# Patient Record
Sex: Male | Born: 1960 | Race: White | Hispanic: No | Smoking: Never smoker
Health system: Southern US, Community
[De-identification: ages and names within clinical notes are randomized; demographics above are authoritative.]

## PROBLEM LIST (undated history)

## (undated) DIAGNOSIS — F419 Anxiety disorder, unspecified: Secondary | ICD-10-CM

## (undated) DIAGNOSIS — M51369 Other intervertebral disc degeneration, lumbar region without mention of lumbar back pain or lower extremity pain: Secondary | ICD-10-CM

## (undated) DIAGNOSIS — M21611 Bunion of right foot: Secondary | ICD-10-CM

## (undated) DIAGNOSIS — F900 Attention-deficit hyperactivity disorder, predominantly inattentive type: Secondary | ICD-10-CM

## (undated) DIAGNOSIS — M5136 Other intervertebral disc degeneration, lumbar region: Secondary | ICD-10-CM

## (undated) DIAGNOSIS — G894 Chronic pain syndrome: Secondary | ICD-10-CM

## (undated) DIAGNOSIS — E669 Obesity, unspecified: Secondary | ICD-10-CM

## (undated) DIAGNOSIS — F32A Depression, unspecified: Secondary | ICD-10-CM

## (undated) DIAGNOSIS — M1712 Unilateral primary osteoarthritis, left knee: Secondary | ICD-10-CM

## (undated) DIAGNOSIS — N529 Male erectile dysfunction, unspecified: Secondary | ICD-10-CM

## (undated) DIAGNOSIS — M109 Gout, unspecified: Secondary | ICD-10-CM

## (undated) DIAGNOSIS — E7800 Pure hypercholesterolemia, unspecified: Secondary | ICD-10-CM

## (undated) DIAGNOSIS — I10 Essential (primary) hypertension: Secondary | ICD-10-CM

## (undated) DIAGNOSIS — F329 Major depressive disorder, single episode, unspecified: Secondary | ICD-10-CM

## (undated) DIAGNOSIS — G4700 Insomnia, unspecified: Secondary | ICD-10-CM

## (undated) DIAGNOSIS — M1A09X0 Idiopathic chronic gout, multiple sites, without tophus (tophi): Secondary | ICD-10-CM

## (undated) HISTORY — DX: Attention-deficit hyperactivity disorder, predominantly inattentive type: F90.0

## (undated) HISTORY — DX: Depression, unspecified: F32.A

## (undated) HISTORY — DX: Anxiety disorder, unspecified: F41.9

## (undated) HISTORY — DX: Insomnia, unspecified: G47.00

## (undated) HISTORY — DX: Male erectile dysfunction, unspecified: N52.9

## (undated) HISTORY — DX: Obesity, unspecified: E66.9

## (undated) HISTORY — DX: Unilateral primary osteoarthritis, left knee: M17.12

## (undated) HISTORY — DX: Idiopathic chronic gout, multiple sites, without tophus (tophi): M1A.09X0

## (undated) HISTORY — DX: Other intervertebral disc degeneration, lumbar region: M51.36

## (undated) HISTORY — DX: Other intervertebral disc degeneration, lumbar region without mention of lumbar back pain or lower extremity pain: M51.369

## (undated) HISTORY — DX: Bunion of right foot: M21.611

## (undated) HISTORY — PX: ANKLE SURGERY: SHX546

## (undated) HISTORY — DX: Chronic pain syndrome: G89.4

---
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
## 2000-11-09 ENCOUNTER — Encounter: Payer: Self-pay | Admitting: Cardiovascular Disease

## 2000-11-09 ENCOUNTER — Ambulatory Visit (HOSPITAL_COMMUNITY): Admission: RE | Admit: 2000-11-09 | Discharge: 2000-11-09 | Payer: Self-pay | Admitting: Cardiovascular Disease

## 2013-12-25 HISTORY — PX: COLONOSCOPY: SHX174

## 2014-05-28 ENCOUNTER — Emergency Department (HOSPITAL_COMMUNITY)
Admission: EM | Admit: 2014-05-28 | Discharge: 2014-05-28 | Disposition: A | Discharge status: Home / Self Care | Payer: Worker's Compensation | Attending: Emergency Medicine | Admitting: Emergency Medicine

## 2014-05-28 ENCOUNTER — Encounter (HOSPITAL_COMMUNITY): Payer: Self-pay | Admitting: Emergency Medicine

## 2014-05-28 ENCOUNTER — Emergency Department (HOSPITAL_COMMUNITY): Payer: Worker's Compensation

## 2014-05-28 DIAGNOSIS — Y9289 Other specified places as the place of occurrence of the external cause: Secondary | ICD-10-CM | POA: Insufficient documentation

## 2014-05-28 DIAGNOSIS — Y9389 Activity, other specified: Secondary | ICD-10-CM | POA: Insufficient documentation

## 2014-05-28 DIAGNOSIS — Z862 Personal history of diseases of the blood and blood-forming organs and certain disorders involving the immune mechanism: Secondary | ICD-10-CM | POA: Insufficient documentation

## 2014-05-28 DIAGNOSIS — Z8639 Personal history of other endocrine, nutritional and metabolic disease: Secondary | ICD-10-CM | POA: Insufficient documentation

## 2014-05-28 DIAGNOSIS — W230XXA Caught, crushed, jammed, or pinched between moving objects, initial encounter: Secondary | ICD-10-CM | POA: Insufficient documentation

## 2014-05-28 DIAGNOSIS — S61209A Unspecified open wound of unspecified finger without damage to nail, initial encounter: Secondary | ICD-10-CM | POA: Insufficient documentation

## 2014-05-28 DIAGNOSIS — I10 Essential (primary) hypertension: Secondary | ICD-10-CM | POA: Insufficient documentation

## 2014-05-28 DIAGNOSIS — S61212A Laceration without foreign body of right middle finger without damage to nail, initial encounter: Secondary | ICD-10-CM

## 2014-05-28 HISTORY — DX: Pure hypercholesterolemia, unspecified: E78.00

## 2014-05-28 HISTORY — DX: Gout, unspecified: M10.9

## 2014-05-28 HISTORY — DX: Essential (primary) hypertension: I10

## 2014-05-28 MED ORDER — HYDROCODONE-ACETAMINOPHEN 5-325 MG PO TABS: 1.0000 | ORAL_TABLET | Freq: Four times a day (QID) | ORAL | Status: DC | PRN

## 2014-05-28 NOTE — Discharge Instructions (Signed)
Please have your sutures removed in 7 days.  Follow instruction below.  Return if you have any concerns.   Fingertip Injuries and Amputations Fingertip injuries are common and often get injured because they are last to escape when pulling your hand out of harm's way. You have amputated (cut off) part of your finger. How this turns out depends largely on how much was amputated. If just the tip is amputated, often the end of the finger will grow back and the finger may return to much the same as it was before the injury.  If more of the finger is missing, your caregiver has done the best with the tissue remaining to allow you to keep as much finger as is possible. Your caregiver after checking your injury has tried to leave you with a painless fingertip that has durable, feeling skin. If possible, your caregiver has tried to maintain the finger's length and appearance and preserve its fingernail.  Please read the instructions outlined below and refer to this sheet in the next few weeks. These instructions provide you with general information on caring for yourself. Your caregiver may also give you specific instructions. While your treatment has been done according to the most current medical practices available, unavoidable complications occasionally occur. If you have any problems or questions after discharge, please call your caregiver. HOME CARE INSTRUCTIONS   You may resume normal diet and activities as directed or allowed.  Keep your hand elevated above the level of your heart. This helps decrease pain and swelling.  Keep ice packs (or a bag of ice wrapped in a towel) on the injured area for 15-20 minutes, 03-04 times per day, for the first two days.  Change dressings if necessary or as directed.  Clean the wound daily or as directed.  Only take over-the-counter or prescription medicines for pain, discomfort, or fever as directed by your caregiver.  Keep appointments as directed. SEEK  IMMEDIATE MEDICAL CARE IF:  You develop redness, swelling, numbness or increasing pain in the wound.  There is pus coming from the wound.  You develop an unexplained oral temperature above 102 F (38.9 C) or as your caregiver suggests.  There is a foul (bad) smell coming from the wound or dressing.  There is a breaking open of the wound (edges not staying together) after sutures or staples have been removed. MAKE SURE YOU:   Understand these instructions.  Will watch your condition.  Will get help right away if you are not doing well or get worse. Document Released: 11/01/2005 Document Revised: 03/04/2012 Document Reviewed: 09/30/2008 [facility] Patient Information 2014 [place], [place].

## 2014-05-28 NOTE — ED Notes (Signed)
PA in to suture

## 2014-05-28 NOTE — ED Notes (Signed)
Ortho tech paged for placement of splint.

## 2014-05-28 NOTE — ED Notes (Signed)
Pt presents with laceration to R 3rd finger after getting finger crushed in cigarette paper rolls.  Injury occurred at work PTA.

## 2014-05-28 NOTE — ED Provider Notes (Signed)
CSN: [id number]     Arrival date & time 05/28/14  1209 History  This chart was scribed for Domenic Moras PA-C working with Adrian, DO by Stacy Gardner, ED scribe. This patient was seen in room TR10C/TR10C and the patient's care was started at 1:00 PM.  First MD Initiated Contact with Patient 05/28/14 1241     No chief complaint on file.    (Consider location/radiation/quality/duration/timing/severity/associated sxs/prior Treatment) The history is provided by the patient and medical records. No language interpreter was used.   HPI Comments: Ryan Bowen is a 53 y.o. male who presents to the Emergency Department complaining of right third finger injury. Pt crushed the tip of the dorsal aspect of his right middle finger while lifting a cigarette paper roll at work today. He wrapped the site in gauze and the bleeding is controlled.  His tetanus shot is UTD. Pt has a regular PCP.  Past Medical History  Diagnosis Date  . Gout   . Hypertension   . Hypercholesteremia    Past Surgical History  Procedure Laterality Date  . Ankle surgery     History reviewed. No pertinent family history. History  Substance Use Topics  . Smoking status: Never Smoker   . Smokeless tobacco: Not on file  . Alcohol Use: Yes    Review of Systems  Skin: Positive for wound.       Right middle finger  All other systems reviewed and are negative.     Allergies  Review of patient's allergies indicates no known allergies.  Home Medications   Prior to Admission medications   Not on File   BP 163/95  Pulse 98  Temp(Src) 98.2 F (36.8 C) (Oral)  Resp 18  Ht 5\' 10"  (1.778 m)  Wt 250 lb (113.399 kg)  BMI 35.87 kg/m2  SpO2 98% Physical Exam  Nursing note and vitals reviewed. Constitutional: He is oriented to person, place, and time. He appears well-developed and well-nourished.  HENT:  Head: Normocephalic.  Eyes: Pupils are equal, round, and reactive to light.  Neck: Normal range of motion.   Cardiovascular: Normal rate.   Pulmonary/Chest: Effort normal.  Musculoskeletal: Normal range of motion. He exhibits tenderness (right third finger).  Neurological: He is alert and oriented to person, place, and time.  Skin: Skin is warm and dry. He is not diaphoretic.  Right hand middle finger evulsed skin to the pad of finger measuring 3 cm in circumferance. Sensation is intact.       ED Course  Procedures (including critical care time) DIAGNOSTIC STUDIES: Oxygen Saturation is 98% on room air, normal by my interpretation.    COORDINATION OF CARE:  1:00 PM Discussed course of care with pt which includes suture repair.  Pt would like to start work soon. I advised to use precaution and using a finger splint. Recommended pt to do not wet the area in less than 12 hours. Discussed with pt the results of the x-ray. Pt understands and agrees. 1:07 PM LACERATION REPAIR Performed by: Domenic Moras PA-C Consent: Verbal consent obtained. Risks and benefits: risks, benefits and alternatives were discussed Patient identity confirmed: provided demographic data Time out performed prior to procedure Prepped and Draped in normal sterile fashion Wound explored Laceration Location: right middle finger Laceration Length: 3 cm No Foreign Bodies seen or palpated Anesthesia: local infiltration Local anesthetic: lidocaine 2% w/o epinephrine Anesthetic total: 6 ml Irrigation method: syringe Amount of cleaning: standard Skin closure: simple Number of sutures or staples: 9  Technique: interrupted Patient tolerance: Patient tolerated the procedure well with no immediate complications.  2:13 PM Xray without acute fx.  Laceration was repaired.  Finger splint applied.  No bone involvement.  abx not indicated at this time.  Pt does made aware for signs of infection.  Sutures remove in 7 days.  Labs Review Labs Reviewed - No data to display  Imaging Review    EKG Interpretation None      MDM    Final diagnoses:  Laceration of right middle finger w/o foreign body w/o damage to nail    BP 143/85  Pulse 89  Temp(Src) 98.2 F (36.8 C) (Oral)  Resp 18  Ht 5\' 10"  (1.778 m)  Wt 250 lb (113.399 kg)  BMI 35.87 kg/m2  SpO2 100%  I have reviewed nursing notes and vital signs. I personally reviewed the imaging tests through PACS system  I reviewed available ER/hospitalization records thought the EMR   I personally performed the services described in this documentation, which was scribed in my presence. The recorded information has been reviewed and is accurate.     Domenic Moras, PA-C 05/28/14 1416

## 2014-05-28 NOTE — ED Notes (Signed)
Pt was loading 70lb boxes when one fell onto right middle finger. Crush injury noted to tip of right middle finger. Pt denies significant pain.

## 2014-05-28 NOTE — ED Provider Notes (Signed)
Medical screening examination/treatment/procedure(s) were performed by non-physician practitioner and as supervising physician I was immediately available for consultation/collaboration.   EKG Interpretation None        Delice Bison Savannah Morford, DO 05/28/14 1546

## 2014-06-24 ENCOUNTER — Ambulatory Visit: Payer: Self-pay

## 2014-07-07 ENCOUNTER — Ambulatory Visit (INDEPENDENT_AMBULATORY_CARE_PROVIDER_SITE_OTHER): Payer: 59

## 2014-07-07 VITALS — BP 150/90 | HR 94 | Resp 16 | Ht 71.0 in | Wt 220.0 lb

## 2014-07-07 DIAGNOSIS — M2020 Hallux rigidus, unspecified foot: Secondary | ICD-10-CM

## 2014-07-07 DIAGNOSIS — M21612 Bunion of left foot: Principal | ICD-10-CM

## 2014-07-07 DIAGNOSIS — M79609 Pain in unspecified limb: Secondary | ICD-10-CM

## 2014-07-07 DIAGNOSIS — M21619 Bunion of unspecified foot: Secondary | ICD-10-CM

## 2014-07-07 DIAGNOSIS — M21611 Bunion of right foot: Secondary | ICD-10-CM

## 2014-07-07 DIAGNOSIS — M79606 Pain in leg, unspecified: Secondary | ICD-10-CM

## 2014-07-07 DIAGNOSIS — M19079 Primary osteoarthritis, unspecified ankle and foot: Secondary | ICD-10-CM

## 2014-07-07 NOTE — Patient Instructions (Signed)
Hallux Rigidus Hallux rigidus is a condition involving pain and a loss of motion of the first (big) toe. The pain gets worse with lifting up (extension) of the toe. This is usually due to arthritic bony bumps (spurring) of the joint at the base of the big toe.  SYMPTOMS   Pain, with lifting up of the toe.  Tenderness over the joint where the big toe meets the foot.  Redness, swelling, and warmth over the top of the base of the big toe (sometimes).  Foot pain, stiffness, and limping. CAUSES  Halllux rigidus is caused by arthritis of the joint where the big toe meets the foot. The arthritis creates a bone spur that pinches the soft tissues, when the toe is extended. RISK INCREASES WITH:  Tight shoes, with a narrow toe box.  Family history of foot problems.  Gout and rheumatoid and psoriatic arthritis.  History of previous toe injury, including "turf toe."  Long first toe, flat feet, and other big toe bony bumps.  Arthritis of the big toe. PREVENTION   Wear wide toed shoes that fit well.  Tape the big toe, to reduce motion and to prevent pinching of the tissues between the bone.  Maintain physical fitness:  Foot and ankle flexibility.  Muscle strength and endurance. PROGNOSIS  This condition can usually be managed with proper treatment. However, surgery is typically required to prevent the problem from recurring.  RELATED COMPLICATIONS  Injury to other areas of the foot or ankle, caused by abnormal walking in an attempt to avoid the pain felt when walking normally. TREATMENT Treatment first involves stopping the activities that aggravate your symptoms. Ice and medicine can be used to reduce the pain and inflammation. Modifications to shoes may help reduce pain, including wearing stiff-soled shoes, shoes with a wide toe box, inserting a padded donut to relieve pressure on top of the joint, or wearing an arch support. Corticosteroid injections may be given to reduce inflammation.  If non-surgical treatment is unsuccessful, surgery may be needed. Surgical options include removing the arthritic bony spur, cutting a bone in the foot to change the arc of motion (allowing the toe to extend more), or fusion of the joint (eliminating all motion in the joint at the base of the big toe).  MEDICATION   If pain medicine is needed, nonsteroidal anti-inflammatory medicines (aspirin and ibuprofen), or other minor pain relievers (acetaminophen), are often advised.  Do not take pain medicine for 7 days before surgery.  Prescription pain relievers are usually prescribed only after surgery. Use only as directed and only as much as you need.  Ointments for arthritis, applied to the skin, may give some relief.  Injections of corticosteroids may be given to reduce inflammation. HEAT AND COLD  Cold treatment (icing) relieves pain and reduces inflammation. Cold treatment should be applied for 10 to 15 minutes every 2 to 3 hours, and immediately after activity that aggravates your symptoms. Use ice packs or an ice massage.  Heat treatment may be used before performing the stretching and strengthening activities prescribed by your caregiver, physical therapist, or athletic trainer. Use a heat pack or a warm water soak. SEEK MEDICAL CARE IF:   Symptoms get worse or do not improve in 2 weeks, despite treatment.  After surgery you develop fever, increasing pain, redness, swelling, drainage of fluids, bleeding, or increasing warmth.  New, unexplained symptoms develop. (Drugs used in treatment may produce side effects.) Document Released: 12/11/2005 Document Revised: 03/04/2012 Document Reviewed: 03/25/2009 [employer] Patient   Information 2015 [place], [place]. This information is not intended to replace advice given to you by your health care provider. Make sure you discuss any questions you have with your health care provider.    The joint implants often used in your situation would be an  Encompas great toe implant

## 2014-07-07 NOTE — Progress Notes (Signed)
   Subjective:    Patient ID: Ryan Bowen, male    DOB: 1961-06-11, 53 y.o.   MRN: [id number]  HPI Comments: N bunions L B/L 1st toes, and MPJ D 1 month O history of episodes of gout C 1st toes abduct and 1st MPJs are enlarged and painful A long period of walking and occasionally the steeled toed shoes T pt states he is on a gout medication, but is not listed     Review of Systems  All other systems reviewed and are negative.      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 Patient has a history of gouty arthropathy flareups is not sure which medicines taking maybe colchicine and taking 3 times daily. Hasn't had a recent gout flareups there is no edema erythema currently however has limited range of motion both great toe joints the bony prominence noted clinically and radiographically both dorsal and the medial bunions are identified  J objective findings as follows vascular status appears to be intact with pedal pulses palpable DP +2/4 bilateral PT plus one over 4 bilateral capillary refill time 3 seconds all digits epicritic and proprioceptive sensations intact and symmetric bilateral there is normal plantar response and DTRs. Dermatologically skin color pigment normal hair growth present nails are unremarkable rectus foot type otherwise noted x-rays reveal asymmetric joint space narrowing and para-articular spurring of the first MTP joints bilateral with large dorsal osteophytes both great toe joints. Patient does have limited range of motion possibly 10-15 total range of the great toe joints bilateral there is pain and crepitus at end ranges of motion. The remaining digits show mild semirigid digital contractures no signs of fracture no other osseous abnormalities are identified. No open wounds ulcerations no other skin issues are  identified.       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 Plan at this time dispensed literature about arthritis of the great toe joint and hallux rigidus patient may be candidate for wrist MTP joint implant or possibly first MTP fusion. Advised that if we did implant the procedure can be done bilateral and the patient would be ambulatory however if the fusion was selected you have be nonweightbearing only 1 foot at a time can be done. I do feel patient is a good candidate for implant arthroplasty at some point his joint is considerably deteriorated this time with very limited range of motion still present patient maintaining good stable shoe moderate activities has been taking hydrocodone for his gout flareups when they do occur. Followup at his convenience for possible further surgical consult when ready next progress Harriet Masson DPM

## 2014-07-10 ENCOUNTER — Ambulatory Visit: Payer: Self-pay

## 2014-11-17 ENCOUNTER — Telehealth: Payer: Self-pay | Admitting: *Deleted

## 2014-11-17 NOTE — Telephone Encounter (Signed)
Pt states he would like to schedule surgery with Dr. Blenda Mounts to replace his big toe joints.  I instructed pt to schedule surgery date and the make an appt prior to the surgery day to discuss surgery and sign consents  Pt chose 12/30/2014.  I referred to the schedulers.

## 2014-11-23 NOTE — Telephone Encounter (Signed)
Surgery consult appointment scheduled

## 2014-12-08 ENCOUNTER — Ambulatory Visit (INDEPENDENT_AMBULATORY_CARE_PROVIDER_SITE_OTHER): Payer: 59

## 2014-12-08 VITALS — BP 154/85 | HR 106 | Resp 18

## 2014-12-08 DIAGNOSIS — M2011 Hallux valgus (acquired), right foot: Secondary | ICD-10-CM

## 2014-12-08 DIAGNOSIS — M19079 Primary osteoarthritis, unspecified ankle and foot: Secondary | ICD-10-CM

## 2014-12-08 DIAGNOSIS — M109 Gout, unspecified: Secondary | ICD-10-CM

## 2014-12-08 DIAGNOSIS — M1A9XX1 Chronic gout, unspecified, with tophus (tophi): Secondary | ICD-10-CM

## 2014-12-08 DIAGNOSIS — M21611 Bunion of right foot: Secondary | ICD-10-CM

## 2014-12-08 DIAGNOSIS — M2012 Hallux valgus (acquired), left foot: Secondary | ICD-10-CM

## 2014-12-08 DIAGNOSIS — M2020 Hallux rigidus, unspecified foot: Secondary | ICD-10-CM

## 2014-12-08 DIAGNOSIS — M21612 Bunion of left foot: Principal | ICD-10-CM

## 2014-12-08 NOTE — Patient Instructions (Signed)
Information for patients with Gout  Gout defined-Gout occurs when urate crystals accumulate in your joint causing the inflammation and intense pain of gout attack.  Urate crystals can form when you have high levels of uric acid in your blood.  Your body produces uric acid when it breaks down prurines-substances that are found naturally in your body, as well as in certain foods such as organ meats, anchioves, herring, asparagus, and mushrooms.  Normally uric acid dissolves in your blood and passes through your kidneys into your urine.  But sometimes your body either produces too much uric acid or your kidneys excrete too little uric acid.  When this happens, uric acid can build up, forming sharp needle-like urate crystals in a joint or surrounding tissue that cause pain, inflammation and swelling.    Gout is characterized by sudden, severe attacks of pain, redness and tenderness in joints, often the joint at the base of the big toe.  Gout is complex form of arthritis that can affect anyone.  Men are more likely to get gout but women become increasingly more susceptible to gout after menopause.  An acute attack of gout can wake you up in the middle of the night with the sensation that your big toe is on fire.  The affected joint is hot, swollen and so tender that even the weight or the sheet on it may seem intolerable.  If you experience symptoms of an acute gout attack it is important to your doctor as soon as the symptoms start.  Gout that goes untreated can lead to worsening pain and joint damage.  Risk Factors:  You are more likely to develop gout if you have high levels of uric acid in your body.    Factors that increase the uric acid level in your body include:  Lifestyle factors.  Excessive alcohol use-generally more than two drinks a day for men and more than one for women increase the risk of gout.  Medical conditions.  Certain conditions make it more likely that you will develop gout.   These include hypertension, and chronic conditions such as diabetes, high levels of fat and cholesterol in the blood, and narrowing of the arteries.  Certain medications.  The uses of Thiazide diuretics- commonly used to treat hypertension and low dose aspirin can also increase uric acid levels.  Family history of gout.  If other members of your family have had gout, you are more likely to develop the disease.  Age and sex. Gout occurs more often in men than it does in women, primarily because women tend to have lower uric acid levels than men do.  Men are more likely to develop gout earlier usually between the ages of 40-50- whereas women generally develop signs and symptoms after menopause.    Tests and diagnosis:  Tests to help diagnose gout may include:  Blood test.  Your doctor may recommend a blood test to measure the uric acid level in your blood .  Blood tests can be misleading, though.  Some people have high uric acid levels but never experience gout.  And some people have signs and symptoms of gout, but don't have unusual levels of uric acid in their blood.  Joint fluid test.  Your doctor may use a needle to draw fluid from your affected joint.  When examined under the microscope, your joint fluid may reveal urate crystals.  Treatment:  Treatment for gout usually involves medications.  What medications you and your doctor choose will be   based on your current health and other medications you currently take.  Gout medications can be used to treat acute gout attacks and prevent future attacks as well as reduce your risk of complications from gout such as the development of tophi from urate crystal deposits.  Alternative medicine:   Certain foods have been studied for their potential to lower uric acid levels, including:  Coffee.  Studies have found an association between coffee drinking (regular and decaf) and lower uric acid levels.  The evidence is not enough to encourage  non-coffee drinkers to start, but it may give clues to new ways of treating gout in the future.  Vitamin C.  Supplements containing vitamin C may reduce the levels of uric acid in your blood.  However, vitamin as a treatment for gout. Don't assume that if a little vitamin C is good, than lots is better.  Megadoses of vitamin C may increase your bodies uric acid levels.  Cherries.  Cherries have been associated with lower levels of uric acid in studies, but it isn't clear if they have any effect on gout signs and symptoms.  Eating more cherries and other dar-colored fruits, such as blackberries, blueberries, purple grapes and raspberries, may be a safe way to support your gout treatment.    Lifestyle/Diet Recommendations:   Drink 8 to 16 cups ( about 2 to 4 liters) of fluid each day, with at least half being water.  Avoid alcohol  Eat a moderate amount of protein, preferably from healthy sources, such as low-fat or fat-free dairy, tofu, eggs, and nut butters.  Limit you daily intake of meat, fish, and poultry to 4 to 6 ounces.  Avoid high fat meats and desserts.  Decrease you intake of shellfish, beef, lamb, pork, eggs and cheese.  Choose a good source of vitamin C daily such as citrus fruits, strawberries, broccoli,  brussel sprouts, papaya, and cantaloupe.   Choose a good source of vitamin A every other day such as yellow fruits, or dark green/yellow vegetables.  Avoid drastic weigh reduction or fasting.  If weigh loss is desired lose it over a period of several months.  See "dietary considerations.." chart for specific food recommendations.  Dietary Considerations for people with Gout  Food with negligible purine content (0-15 mg of purine nitrogen per 100 grams food)  May use as desired except on calorie variations  Non fat milk Cocoa Cereals (except in list II) Hard candies  Buttermilk Carbonated drinks Vegetables (except in list II) Sherbet  Coffee Fruits Sugar Honey  Tea  Cottage Cheese Gelatin-jell-o Salt  Fruit juice Breads Angel food Cake   Herbs/spices Jams/Jellies Carnation Instant Breakfast    Foods that do not contain excessive purine content, but must be limited due to fat content  Cream Eggs Oil and Salad Dressing  Half and Half Peanut Butter Chocolate  Whole Milk Cakes Potato Chips  Butter Ice Cream Fried Foods  Cheese Nuts Waffles, pancakes   List II: Food with moderate purine content (50-150 mg of purine nitrogen per 100 grams of food)  Limit total amount each day to 5 oz. cooked Lean meat, other than those on list III   Poultry, other than those on list III Fish, other than those on list III   Seafood, other than those on list III  These foods may be used occasionally  Peas Lentils Bran  Spinach Oatmeal Dried Beans and Peas  Asparagus Wheat Germ Mushrooms   Additional information about meat choices  Choose fish   and poultry, particularly without skin, often.  Select lean, well trimmed cuts of meat.  Avoid all fatty meats, bacon , sausage, fried meats, fried fish, or poultry, luncheon meats, cold cuts, hot dogs, meats canned or frozen in gravy, spareribs and frozen and packaged prepared meats.   List III: Foods with HIGH purine content / Foods to AVOID (150-800 mg of purine nitrogen per 100 grams of food)  Anchovies Herring Meat Broths  Liver Mackerel Meat Extracts  Kidney Scallops Meat Drippings  Sardines Wild Game Mincemeat  Sweetbreads Goose Gravy  Heart Tongue Yeast, baker's and brewers   Commercial soups made with any of the foods listed in List II or List III  In addition avoid all alcoholic beverages        Pre-Operative Instructions  Congratulations, you have decided to take an important step to improving your quality of life.  You can be assured that the doctors of [facility] will be with you every step of the way.  1. Plan to be at the surgery center/hospital at least 1 (one) hour prior to your scheduled  time unless otherwise directed by the surgical center/hospital staff.  You must have a responsible adult accompany you, remain during the surgery and drive you home.  Make sure you have directions to the surgical center/hospital and know how to get there on time. 2. For hospital based surgery you will need to obtain a history and physical form from your family physician within 1 month prior to the date of surgery- we will give you a form for you primary physician.  3. We make every effort to accommodate the date you request for surgery.  There are however, times where surgery dates or times have to be moved.  We will contact you as soon as possible if a change in schedule is required.   4. No Aspirin/Ibuprofen for one week before surgery.  If you are on aspirin, any non-steroidal anti-inflammatory medications (Mobic, Aleve, Ibuprofen) you should stop taking it 7 days prior to your surgery.  You make take Tylenol  For pain prior to surgery.  5. Medications- If you are taking daily heart and blood pressure medications, seizure, reflux, allergy, asthma, anxiety, pain or diabetes medications, make sure the surgery center/hospital is aware before the day of surgery so they may notify you which medications to take or avoid the day of surgery. 6. No food or drink after midnight the night before surgery unless directed otherwise by surgical center/hospital staff. 7. No alcoholic beverages 24 hours prior to surgery.  No smoking 24 hours prior to or 24 hours after surgery. 8. Wear loose pants or shorts- loose enough to fit over bandages, boots, and casts. 9. No slip on shoes, sneakers are best. 10. Bring your boot with you to the surgery center/hospital.  Also bring crutches or a walker if your physician has prescribed it for you.  If you do not have this equipment, it will be provided for you after surgery. 11. If you have not been contracted by the surgery center/hospital by the day before your surgery, call to  confirm the date and time of your surgery. 12. Leave-time from work may vary depending on the type of surgery you have.  Appropriate arrangements should be made prior to surgery with your employer. 13. Prescriptions will be provided immediately following surgery by your doctor.  Have these filled as soon as possible after surgery and take the medication as directed. 14. Remove nail polish on the  operative foot. 15. Wash the night before surgery.  The night before surgery wash the foot and leg well with the antibacterial soap provided and water paying special attention to beneath the toenails and in between the toes.  Rinse thoroughly with water and dry well with a towel.  Perform this wash unless told not to do so by your physician.  Enclosed: 1 Ice pack (please put in freezer the night before surgery)   1 Hibiclens skin cleaner   Pre-op Instructions  If you have any questions regarding the instructions, do not hesitate to call our office.  [place]: [place], [place] [address] [place]: [address] [place], [place] [address] [phone]  [place]: [address], [place] [address] [phone]  Dr. Kendell Bane DPM, Dr. Ila Mcgill DPM Dr. Harriet Masson DPM, Dr. Lanelle Bal DPM, Dr. Trudie Buckler DPM

## 2014-12-08 NOTE — Progress Notes (Signed)
   Subjective:    Patient ID: Ryan Bowen, male    DOB: 03-28-1961, 53 y.o.   MRN: [id number]  HPI I AM HERE TO TALK ABOUT GETTING SURGERY DONE ON BOTH OF MY FEET    Review of Systems no new findings or systemic changes noted patient does have history of gout however could not taking Lorick we'll follow-up with his primary physician about gout management. An anti-inflammatory but is unaware of the name.     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 There is reduced range of motion pain of first MTP joints bilateral. Has little range of motion possibly 10-15 total on both great toe joints. Remaining digits otherwise semirigid otherwise unremarkable. No open wounds no ulcers no secondary infections at this time. Previously had evaluated discuss this x-rays from previous visit are reviewed revealing severe arthropathy of both great toe joints patient continues to have some issues of gout could not taking Lorick had gout flareup is not currently taking anything for gout based on her medication list however I advised he follow up with his primary physician and check into a gout medication her management that may help keep an foot is stabilized prevent future attacks.       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 Plan at this time recommendation is for implant arthroplasty or Keller bunionectomy with implant likely utilizing Silastic-type implants or silicone-based implants first great toe joints bilateral. Surgery done at [facility] especially center under IV sedation and local anesthetic block understands she will likely be out of work for 6-8 week duration  in a Darco shoe for at least 1 month postop. Agent understands her no guarantees realize that arthritis can recur of the implants can improve function reduce pain however his arthropathy including gouty arthritis can continue tacking caused damage to the great toe joint or adjacent joints. Consent form for [employer] with implant bilateral great toe joints is reviewed and signed all questions asked by the patient are answered there no Indications and surgery rescheduled his convenience in January with the next 3 weeks.  Harriet Masson DPM

## 2014-12-11 ENCOUNTER — Telehealth: Payer: Self-pay

## 2014-12-11 NOTE — Telephone Encounter (Signed)
Pt states he need a note stating he will be out of work for 4 - 6 weeks after 12/30/2014 surgery.

## 2014-12-25 HISTORY — PX: TOE SURGERY: SHX1073

## 2014-12-30 DIAGNOSIS — M2020 Hallux rigidus, unspecified foot: Secondary | ICD-10-CM

## 2014-12-30 DIAGNOSIS — M13879 Other specified arthritis, unspecified ankle and foot: Secondary | ICD-10-CM

## 2014-12-30 DIAGNOSIS — M2010 Hallux valgus (acquired), unspecified foot: Secondary | ICD-10-CM

## 2015-01-05 ENCOUNTER — Ambulatory Visit (INDEPENDENT_AMBULATORY_CARE_PROVIDER_SITE_OTHER): Payer: 59

## 2015-01-05 VITALS — BP 126/72 | HR 79 | Resp 18

## 2015-01-05 DIAGNOSIS — Z09 Encounter for follow-up examination after completed treatment for conditions other than malignant neoplasm: Secondary | ICD-10-CM

## 2015-01-05 DIAGNOSIS — M2020 Hallux rigidus, unspecified foot: Secondary | ICD-10-CM

## 2015-01-05 DIAGNOSIS — M19079 Primary osteoarthritis, unspecified ankle and foot: Secondary | ICD-10-CM

## 2015-01-05 DIAGNOSIS — R609 Edema, unspecified: Secondary | ICD-10-CM

## 2015-01-05 NOTE — Patient Instructions (Signed)
ICE INSTRUCTIONS  Apply ice or cold pack to the affected area at least 3 times a day for 10-15 minutes each time.  You should also use ice after prolonged activity or vigorous exercise.  Do not apply ice longer than 20 minutes at one time.  Always keep a cloth between your skin and the ice pack to prevent burns.  Being consistent and following these instructions will help control your symptoms.  We suggest you purchase a gel ice pack because they are reusable and do bit leak.  Some of them are designed to wrap around the area.  Use the method that works best for you.  Here are some other suggestions for icing.   Use a frozen bag of peas or corn-inexpensive and molds well to your body, usually stays frozen for 10 to 20 minutes.  Wet a towel with cold water and squeeze out the excess until it's damp.  Place in a bag in the freezer for 20 minutes. Then remove and use.  Ice and elevate the feet to help keep down and swelling and pain

## 2015-01-05 NOTE — Progress Notes (Signed)
   Subjective:    Patient ID: Ryan Bowen, male    DOB: 03-25-1961, 54 y.o.   MRN: [id number]  HPI still having some throbing and burning in the joint areas and my heels hurts some and I do ice and keep them up and my surgery was 12/30/13    Review of Systems no new findings or systemic changes noted     Objective:   Physical Exam Lower extremity objective findings reveal vascular status to be intact mild edema and ecchymosis consistent with postop course dressings intact and dry dressing is removed and reapplied at this time new dry sterile compressive dressings applied to both feet up x-rays reveal adequate resection of bone and hypertrophied bone there was a ganglion cyst of the first MTP area right at this time Silastic implants visualized on x-ray good clinical and radiographic alignment there is good range of motion at least 2530 dorsiflexion and 10 plantar flexion both joints be with his dressings in place should improve over time as swelling goes away. Incisions clean dry well coapted no dehiscence mild edema and ecchymosis still having some aching or throbbing relatively recommended elevating ice for the swelling and throbbing.       Assessment & Plan:  Assessment good postop progress following color with implant arthroplasty first MTP area bilateral dispensed anklet to start using next week maintain dressing for one more week dry still dressing reapplied at this time starting next week may removed dressing resume normal bathing and hygiene and continue with range of motion exercises maintaining anklet for compression recheck in 4 weeks for long-term postop follow-up 3 weeks from now may start trying to wear a walking or tennis or athletic shoe around the house temporarily.   f Harriet Masson DPM

## 2015-01-12 NOTE — Progress Notes (Signed)
DOS 12/30/2014 B/L Jake Michaelis bunion implant.

## 2015-01-13 ENCOUNTER — Telehealth: Payer: Self-pay | Admitting: *Deleted

## 2015-01-13 NOTE — Telephone Encounter (Signed)
Pt asked for instructions on dressing removal.  Dr. Erasmo Downer dictation of 01/05/2015 states pt is to wear the surgical dressing 1 more week, then remove, shower at night and then wear the anklet compression through the day.  Orders to pt.

## 2015-02-02 ENCOUNTER — Ambulatory Visit (INDEPENDENT_AMBULATORY_CARE_PROVIDER_SITE_OTHER): Payer: 59

## 2015-02-02 VITALS — BP 149/90 | HR 90 | Resp 12

## 2015-02-02 DIAGNOSIS — M2020 Hallux rigidus, unspecified foot: Secondary | ICD-10-CM

## 2015-02-02 DIAGNOSIS — M2011 Hallux valgus (acquired), right foot: Secondary | ICD-10-CM

## 2015-02-02 DIAGNOSIS — Z09 Encounter for follow-up examination after completed treatment for conditions other than malignant neoplasm: Secondary | ICD-10-CM

## 2015-02-02 DIAGNOSIS — M2012 Hallux valgus (acquired), left foot: Secondary | ICD-10-CM

## 2015-02-02 DIAGNOSIS — M21611 Bunion of right foot: Secondary | ICD-10-CM

## 2015-02-02 DIAGNOSIS — M21612 Bunion of left foot: Principal | ICD-10-CM

## 2015-02-02 DIAGNOSIS — M19079 Primary osteoarthritis, unspecified ankle and foot: Secondary | ICD-10-CM

## 2015-02-02 NOTE — Progress Notes (Signed)
   Subjective:    Patient ID: Ryan Bowen, male    DOB: [birth year], 54 y.o.   MRN: [id number]  HPI   12/30/14 keller bunionectomy with implant both big toes. ''B/L FEET/TOES ARE DOING OK BUT STILL GET SORE SOMEDAYS.''  Review of Systems no new findings or systemic changes     Objective:   Physical Exam Neurovascular status is intact x-rays reveal good position the osteotomy intact Silastic implants bilateral great toe joints. Excellent range of motion probably 40-45 dorsiflexion without crepitus or pain 10 plantarflexion very good range of motion noted bilateral. Still some mild postoperative edema and swelling no ecchymosis noted suture tacks are removed at this time. Patient advised to continue with active passive range of motion exercises.'s 4 weeks status post Jake Michaelis bunionectomy with implant bilateral plan to return in 4 weeks to really work activities without restriction note is given to the patient this time reappointed in 2 months for long-term follow-up and x-ray       Assessment & Plan:  Assessment good postop progress following implant arthroplasty great toe joints bilateral recheck in 2 months for long-term postop follow-up return to work 4 weeks from now.  Harriet Masson DPM

## 2015-02-02 NOTE — Patient Instructions (Signed)
ICE INSTRUCTIONS  Apply ice or cold pack to the affected area at least 3 times a day for 10-15 minutes each time.  You should also use ice after prolonged activity or vigorous exercise.  Do not apply ice longer than 20 minutes at one time.  Always keep a cloth between your skin and the ice pack to prevent burns.  Being consistent and following these instructions will help control your symptoms.  We suggest you purchase a gel ice pack because they are reusable and do bit leak.  Some of them are designed to wrap around the area.  Use the method that works best for you.  Here are some other suggestions for icing.   Use a frozen bag of peas or corn-inexpensive and molds well to your body, usually stays frozen for 10 to 20 minutes.  Wet a towel with cold water and squeeze out the excess until it's damp.  Place in a bag in the freezer for 20 minutes. Then remove and use.  Continue with range of motion exercises to toes daily. Swelling will last for 3-6 months after surgery whenever swelling occurs elevate and use ice pack as much as possible

## 2015-04-06 ENCOUNTER — Ambulatory Visit: Payer: 59

## 2015-04-07 ENCOUNTER — Encounter: Payer: Self-pay | Admitting: Podiatry

## 2015-04-07 ENCOUNTER — Ambulatory Visit (INDEPENDENT_AMBULATORY_CARE_PROVIDER_SITE_OTHER): Payer: 59 | Admitting: Podiatry

## 2015-04-07 ENCOUNTER — Other Ambulatory Visit: Payer: Self-pay | Admitting: Podiatry

## 2015-04-07 ENCOUNTER — Ambulatory Visit (INDEPENDENT_AMBULATORY_CARE_PROVIDER_SITE_OTHER): Payer: 59

## 2015-04-07 VITALS — BP 145/89 | HR 92 | Resp 12

## 2015-04-07 DIAGNOSIS — M2020 Hallux rigidus, unspecified foot: Secondary | ICD-10-CM

## 2015-04-07 DIAGNOSIS — Z09 Encounter for follow-up examination after completed treatment for conditions other than malignant neoplasm: Secondary | ICD-10-CM

## 2015-04-07 DIAGNOSIS — M2010 Hallux valgus (acquired), unspecified foot: Secondary | ICD-10-CM

## 2015-04-13 ENCOUNTER — Encounter: Payer: Self-pay | Admitting: Podiatry

## 2015-04-13 NOTE — Progress Notes (Signed)
Patient ID: Ryan Bowen, male   DOB: 18-Jul-1961, 54 y.o.   MRN: [id number]  Subjective: 54 year old male presents the office today status post Jake Michaelis bunionectomy with implant bilateral first MTPJ's performed on 12/30/2014 with Dr. Blenda Mounts. He states that overall he is been doing well although he does intermittently pain to the surgical sites and overlying the outside aspect of the left foot. He States That He Occasionally Has Swelling Overlying the Site without Any Associated Erythema or Increase in Warmth. He Has Been Continuing Regular Shoe Gear As Tolerated. He Denies Any Systemic Complaints As Fevers, Chills, Nausea, Vomiting. Denies Any Calf Pain, Chest Pain, Shortness of Breath. No Other Complaints at This Time in No Acute Changes since Last Appointment.  Objective: AAO 3, NAD DP/PT pulses palpable, CRT less than 3 seconds Protective sensation intact with Simms Weinstein monofilament, Achilles tendon reflex intact. Incisions overlying bilateral first MTPJ's are well healed at this time with a well-healed scar. There is no swelling erythema, increase in warmth, or any other clinical signs of infection. There is mild edema overlying both surgical sites. On the left foot there is mild tenderness over on the lateral aspect of the midfoot. There is no specific area pinpoint bony tenderness and there is no pain with vibratory sensation to the foot/ankle. Range of motion is intact. MMT 5/5.  No other areas of tenderness to bilateral lower extremities. No pain with calf compression, swelling, warmth, erythema. No open lesions or pre-ulcerative lesions identified.  Assessment: 54 year old male status post bilateral caliper bunionectomy with implant  Plan: -X-rays were obtained and reviewed with the patient -Treatment options discussed included alternatives, risks, complications -At this time continue with regular shoe gear. Discussed with him that the symptoms on the left foot and a be due to  compensation due to intermittent pain overlying the first MTPJ. Discussed purchasing an over-the-counter pair orthotics to help support his foot while healing.  -If the symptoms have not resolved within 2 weeks to call the office for follow-up appointment and repeat x-rays otherwise follow up in 4 weeks. Encouraged to call the office with any questions or concerns or any change in symptoms.

## 2015-05-05 ENCOUNTER — Ambulatory Visit: Payer: 59 | Admitting: Podiatry

## 2015-06-09 ENCOUNTER — Ambulatory Visit (INDEPENDENT_AMBULATORY_CARE_PROVIDER_SITE_OTHER): Payer: 59 | Admitting: Podiatry

## 2015-06-09 ENCOUNTER — Ambulatory Visit (INDEPENDENT_AMBULATORY_CARE_PROVIDER_SITE_OTHER): Payer: 59

## 2015-06-09 ENCOUNTER — Encounter: Payer: Self-pay | Admitting: Podiatry

## 2015-06-09 VITALS — BP 150/102 | HR 103 | Resp 15

## 2015-06-09 DIAGNOSIS — M2020 Hallux rigidus, unspecified foot: Secondary | ICD-10-CM

## 2015-06-09 DIAGNOSIS — Z09 Encounter for follow-up examination after completed treatment for conditions other than malignant neoplasm: Secondary | ICD-10-CM

## 2015-06-19 NOTE — Progress Notes (Signed)
Patient ID: Ryan Bowen, male   DOB: 12/25/1961, 54 y.o.   MRN: [id number]  Subjective: 54 year old male presents the office today status post Jake Michaelis bunionectomy with implant bilateral first MTPJ's performed on 12/30/2014 with Dr. Blenda Mounts. He states his his last appointment he is doing conservatively better and he has had no pain to the area since last appointment. He is able to wear regular shoe without any difficulty. He is able to perform daily activities without any pain. He denies any systemic complaints as fevers, chills, nausea, vomiting. Denies any calf pain, chest pain, soreness of breath. No other complaints at this time in no acute changes since last appointment.  Objective: AAO 3, NAD DP/PT pulses palpable, CRT less than 3 seconds Protective sensation intact with Simms Weinstein monofilament, Achilles tendon reflex intact. Incisions overlying bilateral first MTPJ's are well healed at this time with a well-healed scar. There is no surrounding erythema, ascending cellulitis, increase in warmth, or any other clinical signs of infection. There is mild edema overlying both surgical sites, however improved. On the left foot there is no tenderness over on the lateral aspect of the midfoot which he had previously. There are no areas pinpoint bony tenderness and there is no pain with vibratory sensation to the foot/ankle bilaterally. Range of motion is intact. MMT 5/5.  No other areas of tenderness to bilateral lower extremities. No pain with calf compression, swelling, warmth, erythema. No open lesions or pre-ulcerative lesions identified.  Assessment: 54 year old male status post bilateral caliper bunionectomy with implant; doing well.   Plan: -Treatment options discussed included alternatives, risks, complications -Can continue finger shoe gear as tolerated. Also discussed him he can continue regular activity. There is any increase in pain with activity to call the office. Follow-up as  needed. In the meantime I encouraged call the office with any questions, concerns, change in symptoms.  Celesta Gentile, DPM

## 2015-11-04 IMAGING — CR DG FINGER MIDDLE 2+V*R*
3 series · 3 of 3 positions shown · non-contrast
Comparison: None.

CLINICAL DATA: mashed with heavy box

EXAM:
RIGHT MIDDLE FINGER 2+V

[x finger pa right]
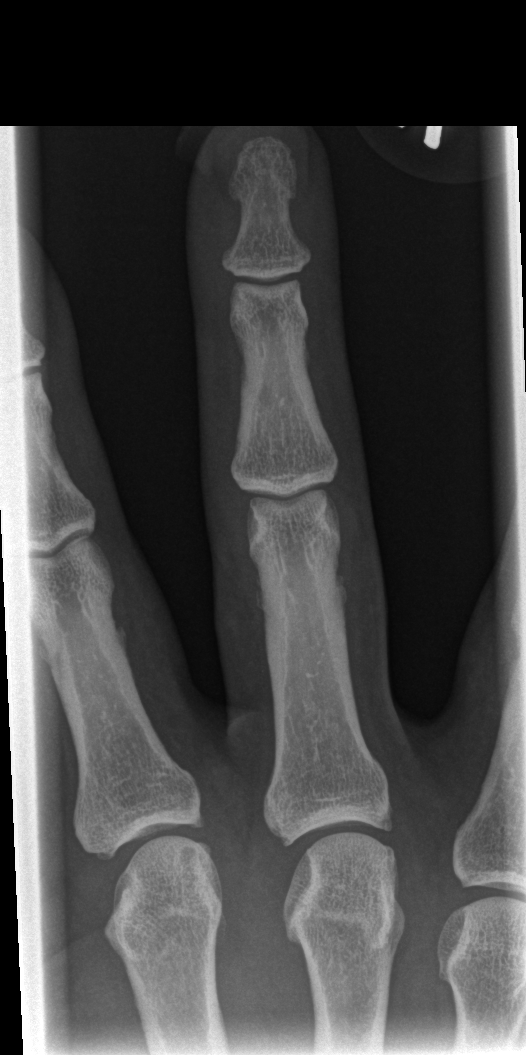

[x finger obl. right]
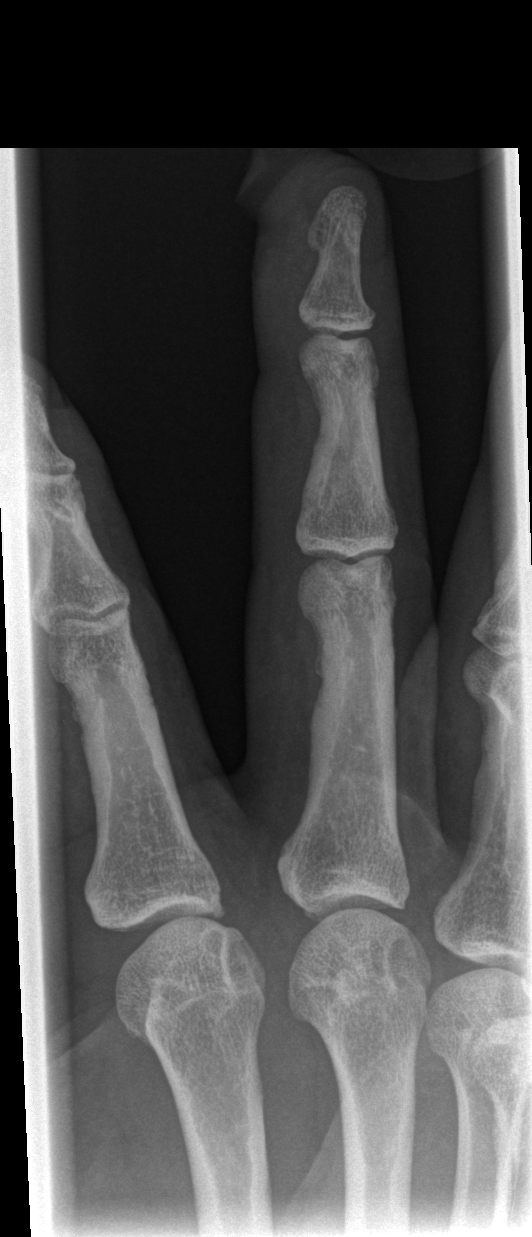

[x finger lateral right]
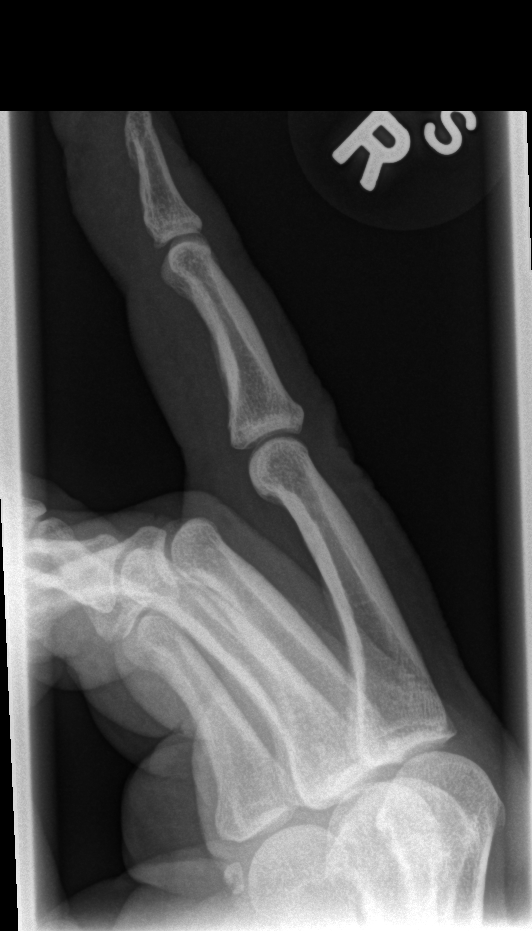

[3 of 3 positions shown; findings below may reference images not displayed]

FINDINGS: There is no evidence of fracture or dislocation. There is no
evidence of arthropathy or other focal bone abnormality. Soft
tissues are unremarkable.
IMPRESSION: Negative.

## 2016-03-10 ENCOUNTER — Ambulatory Visit (INDEPENDENT_AMBULATORY_CARE_PROVIDER_SITE_OTHER): Payer: Commercial Managed Care - HMO

## 2016-03-10 ENCOUNTER — Ambulatory Visit (INDEPENDENT_AMBULATORY_CARE_PROVIDER_SITE_OTHER): Payer: Commercial Managed Care - HMO | Admitting: Podiatry

## 2016-03-10 ENCOUNTER — Encounter: Payer: Self-pay | Admitting: Podiatry

## 2016-03-10 DIAGNOSIS — R52 Pain, unspecified: Secondary | ICD-10-CM

## 2016-03-10 DIAGNOSIS — L03031 Cellulitis of right toe: Secondary | ICD-10-CM

## 2016-03-10 DIAGNOSIS — M109 Gout, unspecified: Secondary | ICD-10-CM

## 2016-03-10 DIAGNOSIS — M10071 Idiopathic gout, right ankle and foot: Secondary | ICD-10-CM

## 2016-03-10 DIAGNOSIS — M2040 Other hammer toe(s) (acquired), unspecified foot: Secondary | ICD-10-CM

## 2016-03-10 MED ORDER — INDOMETHACIN 50 MG PO CAPS
50.0000 mg | ORAL_CAPSULE | Freq: Three times a day (TID) | ORAL | Status: DC
Start: 2016-03-10 — End: 2019-10-09

## 2016-03-10 MED ORDER — CEPHALEXIN 500 MG PO CAPS: 500.0000 mg | ORAL_CAPSULE | Freq: Three times a day (TID) | ORAL | Status: DC

## 2016-03-10 NOTE — Progress Notes (Signed)
Patient ID: Ryan Bowen, male   DOB: February 26, 1961, 55 y.o.   MRN: [id number]  Subjective: 55 year old male presents the office today for concerns of his toes contracting to both the second toes and the right second toe is been red over the last couple of days. He states it was more red and swollen and has improved of the last day or so. Denies any recent injury or trauma. No drainage from the toenail. No open sore. No tingling or numbness. Denies any systemic complaints such as fevers, chills, nausea, vomiting. No acute changes since last appointment, and no other complaints at this time.   Objective: AAO x3, NAD DP/PT pulses palpable bilaterally, CRT less than 3 seconds Protective sensation intact with Simms Weinstein monofilament There semirigid hammertoe contractures present bilateral with second toes the worse. There appears to be localized edema and erythema to the dorsal aspect of the right second toe at the level the DIPJ and PIPJ. There is no fluctuance or crepitus. There is no open sore. There is no ascending cellulitis. No areas of pinpoint bony tenderness or pain with vibratory sensation. MMT 5/5, ROM WNL. No edema, erythema, increase in warmth to bilateral lower extremities.  No open lesions or pre-ulcerative lesions.  No pain with calf compression, swelling, warmth, erythema  Assessment: 55 year old male with hammertoe contractures and likely gout versus synovitis right second toe.  Plan: -All treatment options discussed with the patient including all alternatives, risks, complications.  -X-rays were obtained and reviewed with the patient. No definitive evidence of acute fracture stress fracture. No definitive osteomyelitis. -Offloading pads were dispensed. -He has a history of gout and I believe this may be gout of the right second toe. Prescribed indocin. In case of infection also we'll start Keflex. These were sent to his pharmacy today. -Monitor for any clinical signs or  symptoms of infection and directed to call the office immediately should any occur or go to the ER. -Follow-up in 2 weeks or sooner if any issues are to arise. -Patient encouraged to call the office with any questions, concerns, change in symptoms.   Celesta Gentile, DPM

## 2019-01-23 ENCOUNTER — Ambulatory Visit (INDEPENDENT_AMBULATORY_CARE_PROVIDER_SITE_OTHER): Payer: 59 | Admitting: Otolaryngology

## 2019-01-23 DIAGNOSIS — H903 Sensorineural hearing loss, bilateral: Secondary | ICD-10-CM

## 2019-08-07 ENCOUNTER — Ambulatory Visit: Payer: 59 | Admitting: Family Medicine

## 2019-08-20 ENCOUNTER — Encounter: Payer: Self-pay | Admitting: Family Medicine

## 2019-09-04 ENCOUNTER — Ambulatory Visit: Payer: 59 | Admitting: Family Medicine

## 2019-10-03 ENCOUNTER — Telehealth: Payer: Self-pay

## 2019-10-03 NOTE — Telephone Encounter (Signed)
Received medical records from [place].  I reviewed records and abstracted information into pts chart.   Records have been placed on Dr. Idelle Leech desk for review.

## 2019-10-08 ENCOUNTER — Encounter: Payer: Self-pay | Admitting: Family Medicine

## 2019-10-09 ENCOUNTER — Other Ambulatory Visit: Payer: Self-pay

## 2019-10-09 ENCOUNTER — Ambulatory Visit: Payer: 59 | Admitting: Family Medicine

## 2019-10-09 ENCOUNTER — Encounter: Payer: Self-pay | Admitting: Family Medicine

## 2019-10-09 VITALS — BP 168/98 | HR 98 | Temp 98.1°F | Resp 16 | Ht 66.0 in | Wt 241.0 lb

## 2019-10-09 DIAGNOSIS — E782 Mixed hyperlipidemia: Secondary | ICD-10-CM

## 2019-10-09 DIAGNOSIS — I10 Essential (primary) hypertension: Secondary | ICD-10-CM | POA: Diagnosis not present

## 2019-10-09 DIAGNOSIS — F411 Generalized anxiety disorder: Secondary | ICD-10-CM | POA: Diagnosis not present

## 2019-10-09 DIAGNOSIS — F988 Other specified behavioral and emotional disorders with onset usually occurring in childhood and adolescence: Secondary | ICD-10-CM | POA: Diagnosis not present

## 2019-10-09 DIAGNOSIS — Z79899 Other long term (current) drug therapy: Secondary | ICD-10-CM | POA: Diagnosis not present

## 2019-10-09 DIAGNOSIS — G894 Chronic pain syndrome: Secondary | ICD-10-CM

## 2019-10-09 LAB — BASIC METABOLIC PANEL
BUN: 15 mg/dL (ref 6–23)
CO2: 29 mEq/L (ref 19–32)
Calcium: 9.7 mg/dL (ref 8.4–10.5)
Chloride: 104 mEq/L (ref 96–112)
Creatinine, Ser: 0.81 mg/dL (ref 0.40–1.50)
GFR: 97.64 mL/min (ref >60.00)
Glucose, Bld: 106 mg/dL — ABNORMAL HIGH (ref 70–99)
Potassium: 4.2 mEq/L (ref 3.5–5.1)
Sodium: 138 mEq/L (ref 135–145)

## 2019-10-09 MED ORDER — TADALAFIL 5 MG PO TABS: 5.0000 mg | ORAL_TABLET | Freq: Every day | ORAL | 6 refills | Status: AC | PRN

## 2019-10-09 MED ORDER — AMPHETAMINE-DEXTROAMPHET ER 30 MG PO CP24: 30.0000 mg | ORAL_CAPSULE | ORAL | 0 refills | Status: AC

## 2019-10-09 MED ORDER — ALPRAZOLAM 0.25 MG PO TABS: 0.2500 mg | ORAL_TABLET | Freq: Three times a day (TID) | ORAL | 0 refills | Status: DC | PRN

## 2019-10-09 MED ORDER — LOSARTAN POTASSIUM 100 MG PO TABS: 100.0000 mg | ORAL_TABLET | Freq: Every day | ORAL | 5 refills | Status: AC

## 2019-10-09 MED ORDER — AMPHETAMINE-DEXTROAMPHETAMINE 15 MG PO TABS: ORAL_TABLET | ORAL | 0 refills | Status: DC

## 2019-10-09 MED ORDER — FENOFIBRATE 145 MG PO TABS: 145.0000 mg | ORAL_TABLET | Freq: Every day | ORAL | 1 refills | Status: AC

## 2019-10-09 MED ORDER — PRAVASTATIN SODIUM 40 MG PO TABS: 40.0000 mg | ORAL_TABLET | Freq: Every day | ORAL | 1 refills | Status: AC

## 2019-10-09 MED ORDER — METOPROLOL SUCCINATE ER 100 MG PO TB24: 100.0000 mg | ORAL_TABLET | Freq: Every day | ORAL | 1 refills | Status: AC

## 2019-10-09 NOTE — Progress Notes (Signed)
Office Note 10/09/2019  CC:  Chief Complaint  Patient presents with  . Establish Care    Dr. Melina Copa was old pcp and he retired.    HPI:  Ryan Bowen is a 58 y.o. White male who is here to establish care, f/u HTN and other chronic medical problems. Patient's most recent primary MD: Dr. Melina Copa at [facility] in [place], [place]. Old records were reviewed prior to or during today's visit. Most recent f/u visits show pt to consistently be on percocet 10/325 (1 q6h prn, #120per mo), adderall XR 30mg  qAM and adderall 20mg  q afternoon, ambien 10mg  qhs, and alprazolam 1mg  bid-tid prn (#75/mo). This was before August this year.  In September and October his controlled substances were rx'd by Dr. Vira Browns at [facility] (in [place] per pt).  PMP AWARE reviewed today.  Pt repeatedly has trouble today with recalling his past MDs and office names, says he didn't really think of it before hand and is not good at remembering these type of things.  HTN: monitors bp at work sometimes and pt estimates 140s/80s  (monthly). ADHD and chronic anxiety reportedly stable on current doses of adderall and alprazolam (of note, his alpraz dose by Dr. Melina Copa was 1mg  bid-tid prn, but his dose from the last 2 months by Dr. Carney Bern was 0.25mg  strength). Hyperchol: tolerating statin.    ROS: no CP, no SOB, no wheezing, no cough, no dizziness, no HAs, no rashes, no melena/hematochezia.  No polyuria or polydipsia.  No myalgias.  Chronic pain in L knee, both feet, low back.  No fevers.  Past Medical History:  Diagnosis Date  . ADHD, predominantly inattentive type   . Anxiety and depression   . Bilateral bunions    aquired->gout damage->surgery 2016  . Chronic pain syndrome    LBP and chronic bilat great toe pain  . DDD (degenerative disc disease), lumbar   . Erectile dysfunction   . Hypercholesteremia   . Hypertension   . Idiopathic chronic gout of multiple sites without tophus   . Insomnia    . Obesity   . Primary osteoarthritis of left knee    Dr. Ronnie Derby (MRI fall 2020 per pt, no results known)    Past Surgical History:  Procedure Laterality Date  . COLONOSCOPY  2015   screening->normal.  Recall 2025.  Pt does not recall what MD/office did this procedure.  . TOE SURGERY  12/2014   Mateo Flow with implant bilat great toe joints (Dr. Gus Height great toe MTPs had chronic gouty arthritis changes/damage    Family History  Problem Relation Age of Onset  . Hypertension Mother   . Diabetes Mother     Social History   Socioeconomic History  . Marital status: Divorced    Spouse name: Not on file  . Number of children: Not on file  . Years of education: Not on file  . Highest education level: Not on file  Occupational History  . Not on file  Social Needs  . Financial resource strain: Not on file  . Food insecurity    Worry: Not on file    Inability: Not on file  . Transportation needs    Medical: Not on file    Non-medical: Not on file  Tobacco Use  . Smoking status: Never Smoker  . Smokeless tobacco: Never Used  Substance and Sexual Activity  . Alcohol use: Yes  . Drug use: No  . Sexual activity: Not on file  Lifestyle  .  Physical activity    Days per week: Not on file    Minutes per session: Not on file  . Stress: Not on file  Relationships  . Social Herbalist on phone: Not on file    Gets together: Not on file    Attends religious service: Not on file    Active member of club or organization: Not on file    Attends meetings of clubs or organizations: Not on file    Relationship status: Not on file  . Intimate partner violence    Fear of current or ex partner: Not on file    Emotionally abused: Not on file    Physically abused: Not on file    Forced sexual activity: Not on file  Other Topics Concern  . Not on file  Social History Narrative  . Not on file    Outpatient Encounter Medications as of 10/09/2019  Medication  Sig  . ALPRAZolam (XANAX) 1 MG tablet   . amphetamine-dextroamphetamine (ADDERALL XR) 30 MG 24 hr capsule   . amphetamine-dextroamphetamine (ADDERALL) 30 MG tablet Take 30 mg by mouth at bedtime.  . fenofibrate (TRICOR) 145 MG tablet daily.  . metoprolol succinate (TOPROL-XL) 100 MG 24 hr tablet Take 100 mg by mouth daily. Take with or immediately following a meal.  . oxyCODONE-acetaminophen (PERCOCET/ROXICET) 5-325 MG per tablet   . oxyCODONE-acetaminophen (PERCOCET/ROXICET) 5-325 MG per tablet Take 1-2 tablets by mouth every 4 (four) hours as needed for severe pain.  . pravastatin (PRAVACHOL) 40 MG tablet Take 40 mg by mouth daily.  . tadalafil (CIALIS) 5 MG tablet Take 5 mg by mouth daily as needed for erectile dysfunction.  . [DISCONTINUED] losartan (COZAAR) 25 MG tablet Take 25 mg by mouth daily.  Marland Kitchen losartan (COZAAR) 100 MG tablet Take 1 tablet (100 mg total) by mouth daily.  . [DISCONTINUED] amLODipine (NORVASC) 5 MG tablet Take 5 mg by mouth daily.  . [DISCONTINUED] amphetamine-dextroamphetamine (ADDERALL) 15 MG tablet Take 15 mg by mouth daily.  . [DISCONTINUED] cephALEXin (KEFLEX) 500 MG capsule Take 500 mg by mouth 4 (four) times daily.  . [DISCONTINUED] cephALEXin (KEFLEX) 500 MG capsule Take 1 capsule (500 mg total) by mouth 3 (three) times daily. (Patient not taking: Reported on 10/09/2019)  . [DISCONTINUED] CITALOPRAM HYDROBROMIDE PO Take by mouth.  . [DISCONTINUED] HYDROcodone-acetaminophen (NORCO) 10-325 MG per tablet   . [DISCONTINUED] HYDROcodone-acetaminophen (NORCO/VICODIN) 5-325 MG per tablet Take 1 tablet by mouth every 6 (six) hours as needed for moderate pain or severe pain. (Patient not taking: Reported on 10/09/2019)  . [DISCONTINUED] indomethacin (INDOCIN) 50 MG capsule Take 1 capsule (50 mg total) by mouth 3 (three) times daily with meals. (Patient not taking: Reported on 10/09/2019)  . [DISCONTINUED] rosuvastatin (CRESTOR) 20 MG tablet Take 20 mg by mouth daily.  .  [DISCONTINUED] telmisartan (MICARDIS) 80 MG tablet Take 80 mg by mouth daily.   No facility-administered encounter medications on file as of 10/09/2019.     No Known Allergies  PE; Blood pressure (!) 168/98, pulse 98, temperature 98.1 F (36.7 C), temperature source Temporal, resp. rate 16, height 5\' 6"  (1.676 m), weight 241 lb (109.3 kg), SpO2 98 %. Body mass index is 38.9 kg/m.  Gen: Alert, well appearing.  Patient is oriented to person, place, time, and situation. AFFECT: pleasant, lucid thought and speech. [id number]: no injection, icteris, swelling, or exudate.  EOMI, PERRLA. Mouth: lips without lesion/swelling.  Oral mucosa pink and moist. Oropharynx without erythema, exudate, or  swelling.  CV: RRR, no m/r/g.   LUNGS: CTA bilat, nonlabored resps, good aeration in all lung fields. EXT: no clubbing or cyanosis.  no edema.  No tremors or fidgety behavior.  Pertinent labs:  None today  ASSESSMENT AND PLAN:   New pt; prior PCP records reviewed.  1) Uncontrolled HTN:  Increase losartan to 100mg  qd today. BMET today.  2) Hypercholesterolemia: continue pravachol and fenofibrate.  3) Adult ADD: The current medical regimen is effective;  continue present plan and medications.   Controlled substance contract reviewed with patient today.  Patient signed this and it will be placed in the chart.  Will rx 1 mo supply of his meds (as per Dr. Carmie End dosing--adderall xr 30mg  qAM and adderall 15mg  q afternoon) and will continue rx's if UDS done today shows appropriate results (should show amphetamines, benzos, and opioids only).  He'll need q3 mo f/u.  4) Chronic anxiety, hx of depression: No antidepressant at this time and pt mood fine. Continue xanax 0.25mg  qid prn->I'll do 1 mo supply today, to be continued if UDS today shows appropriate results.  5) Chronic pain syndrome: L knee and lumbar spine osteoarthritis, with chronic bilat MTP joint pain. Cont pain mgmt/oxycodone via [facility]  pain mgmt.  An After Visit Summary was printed and given to the patient.  Spent 35 min with pt today, with >50% of this time spent in counseling and care coordination regarding the above problems.  Return in about 4 weeks (around 11/06/2019) for annual CPE (fasting).  Signed:  Crissie Sickles, MD           10/09/2019

## 2019-10-11 LAB — PAIN MGMT, PROFILE 8 W/CONF, U
6 Acetylmorphine: NEGATIVE ng/mL
Alcohol Metabolites: NEGATIVE ng/mL (ref <500)
Alphahydroxyalprazolam: 51 ng/mL
Alphahydroxymidazolam: NEGATIVE ng/mL
Alphahydroxytriazolam: NEGATIVE ng/mL
Aminoclonazepam: NEGATIVE ng/mL
Amphetamines: NEGATIVE ng/mL
Benzodiazepines: POSITIVE ng/mL
Buprenorphine, Urine: POSITIVE ng/mL
Buprenorphine: 49.7 ng/mL
Cocaine Metabolite: NEGATIVE ng/mL
Codeine: NEGATIVE ng/mL
Creatinine: 175.3 mg/dL
Ethyl Glucuronide (ETG): NEGATIVE ng/mL
Ethyl Sulfate (ETS): NEGATIVE ng/mL
Hydrocodone: NEGATIVE ng/mL
Hydromorphone: NEGATIVE ng/mL
Hydroxyethylflurazepam: NEGATIVE ng/mL
Lorazepam: NEGATIVE ng/mL
MDA: NEGATIVE ng/mL
MDMA: NEGATIVE ng/mL
MDMA: NEGATIVE ng/mL
Marijuana Metabolite: NEGATIVE ng/mL
Morphine: NEGATIVE ng/mL
Norbuprenorphine: 124.9 ng/mL
Nordiazepam: NEGATIVE ng/mL
Norhydrocodone: NEGATIVE ng/mL
Noroxycodone: 3212 ng/mL
Opiates: NEGATIVE ng/mL
Oxazepam: NEGATIVE ng/mL
Oxidant: NEGATIVE ug/mL
Oxycodone: 3844 ng/mL
Oxycodone: POSITIVE ng/mL
Oxymorphone: >10000 ng/mL
Temazepam: NEGATIVE ng/mL
pH: 7.4 (ref 4.5–9.0)

## 2019-10-15 ENCOUNTER — Other Ambulatory Visit: Payer: Self-pay | Admitting: Family Medicine

## 2019-10-15 ENCOUNTER — Encounter: Payer: Self-pay | Admitting: Family Medicine

## 2019-11-13 ENCOUNTER — Encounter: Payer: 59 | Admitting: Family Medicine

## 2019-11-25 ENCOUNTER — Other Ambulatory Visit: Payer: Self-pay

## 2019-11-27 ENCOUNTER — Other Ambulatory Visit: Payer: Self-pay

## 2019-11-27 ENCOUNTER — Ambulatory Visit (INDEPENDENT_AMBULATORY_CARE_PROVIDER_SITE_OTHER): Payer: 59 | Admitting: Family Medicine

## 2019-11-27 ENCOUNTER — Encounter: Payer: Self-pay | Admitting: Family Medicine

## 2019-11-27 VITALS — BP 163/75 | HR 74 | Temp 98.1°F | Resp 16 | Ht 66.0 in | Wt 237.0 lb

## 2019-11-27 DIAGNOSIS — Z125 Encounter for screening for malignant neoplasm of prostate: Secondary | ICD-10-CM | POA: Diagnosis not present

## 2019-11-27 DIAGNOSIS — E782 Mixed hyperlipidemia: Secondary | ICD-10-CM | POA: Diagnosis not present

## 2019-11-27 DIAGNOSIS — I10 Essential (primary) hypertension: Secondary | ICD-10-CM

## 2019-11-27 DIAGNOSIS — E669 Obesity, unspecified: Secondary | ICD-10-CM | POA: Diagnosis not present

## 2019-11-27 NOTE — Progress Notes (Signed)
Office Note 11/27/2019  CC:  Chief Complaint  Patient presents with  . Annual Exam    pt is fasting   HPI:  Ryan Bowen is a 58 y.o. White male who is here for annual health maintenance exam. Of note, he failed his UDS 09/2019 (establish care visit) so I had to decline to continue any controlled substance (alpraz, ambien, adderall). He is rx'd opioids by [facility] pain mgmt.   Acute L elbow swelling and pain yesterday, took NSAID and this helped a lot. The swelling was diffuse on posterio aspect of L elbow, not particularly the olecranon bursa. It was red and hot and very tender to touch, had trouble fully extending L arm. Hx of gout but he only got it in his toes-->he has had these toe joints replaced.  PT EXPRESSED BEING UPSET B/C HE THOUGHT HE WAS COMING IN FOR LAB WORK FOR CHOLESTEROL AND THEN WAS TOLD THAT HE IS GETTING A PHYSICAL EXAM. I ASSURED HIM THAT I WAS STILL GOING TO CHECK HIS CHOLESTEROL. HE ASKED "WHY DOES ALL OF THIS HAVE TO BE DONE?  IS THIS ALL NEW?    Past Medical History:  Diagnosis Date  . ADHD, predominantly inattentive type    Failed UDS 09/2019->no further controlled substances  . Anxiety and depression    failed UDS 09/2019->no controlled substances  . Bilateral bunions    aquired->gout damage->surgery 2016  . Chronic pain syndrome    LBP and chronic bilat great toe pain->pain mgmt MD as of 07/2019.  . DDD (degenerative disc disease), lumbar   . Erectile dysfunction   . Hypercholesteremia   . Hypertension   . Idiopathic chronic gout of multiple sites without tophus   . Insomnia   . Obesity   . Primary osteoarthritis of left knee    Dr. Ronnie Derby (MRI fall 2020 per pt, no results known)    Past Surgical History:  Procedure Laterality Date  . COLONOSCOPY  2015   screening->normal.  Recall 2025.  Pt does not recall what MD/office did this procedure.  . TOE SURGERY  12/2014   Mateo Flow with implant bilat great toe joints (Dr.  Gus Height great toe MTPs had chronic gouty arthritis changes/damage    Family History  Problem Relation Age of Onset  . Hypertension Mother   . Diabetes Mother     Social History   Socioeconomic History  . Marital status: Divorced    Spouse name: Not on file  . Number of children: Not on file  . Years of education: Not on file  . Highest education level: Not on file  Occupational History  . Not on file  Social Needs  . Financial resource strain: Not on file  . Food insecurity    Worry: Not on file    Inability: Not on file  . Transportation needs    Medical: Not on file    Non-medical: Not on file  Tobacco Use  . Smoking status: Never Smoker  . Smokeless tobacco: Never Used  Substance and Sexual Activity  . Alcohol use: Yes  . Drug use: No  . Sexual activity: Not on file  Lifestyle  . Physical activity    Days per week: Not on file    Minutes per session: Not on file  . Stress: Not on file  Relationships  . Social Herbalist on phone: Not on file    Gets together: Not on file    Attends religious service: Not on file  Active member of club or organization: Not on file    Attends meetings of clubs or organizations: Not on file    Relationship status: Not on file  . Intimate partner violence    Fear of current or ex partner: Not on file    Emotionally abused: Not on file    Physically abused: Not on file    Forced sexual activity: Not on file  Other Topics Concern  . Not on file  Social History Narrative   Divorced, one adult son who lives in [place], [place].   Pt originally from [place], [place].   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     Alc: "a six pack per week".    Outpatient Medications Prior to Visit  Medication Sig Dispense Refill  . amphetamine-dextroamphetamine (ADDERALL XR) 30 MG 24 hr capsule Take 1 capsule (30 mg total) by mouth every morning. 30 capsule 0  . fenofibrate (TRICOR) 145 MG tablet Take 1  tablet (145 mg total) by mouth daily. 90 tablet 1  . losartan (COZAAR) 100 MG tablet Take 1 tablet (100 mg total) by mouth daily. 30 tablet 5  . metoprolol succinate (TOPROL-XL) 100 MG 24 hr tablet Take 1 tablet (100 mg total) by mouth daily. Take with or immediately following a meal. 90 tablet 1  . Oxycodone HCl 10 MG TABS     . oxyCODONE-acetaminophen (PERCOCET/ROXICET) 5-325 MG per tablet     . pravastatin (PRAVACHOL) 40 MG tablet Take 1 tablet (40 mg total) by mouth daily. 90 tablet 1  . tadalafil (CIALIS) 5 MG tablet Take 1 tablet (5 mg total) by mouth daily as needed for erectile dysfunction. 10 tablet 6  . oxyCODONE-acetaminophen (PERCOCET/ROXICET) 5-325 MG per tablet Take 1-2 tablets by mouth every 4 (four) hours as needed for severe pain.     No facility-administered medications prior to visit.     No Known Allergies  ROS Review of Systems  Constitutional: Negative for fatigue and fever.  HENT: Negative for congestion and sore throat.   Eyes: Negative for visual disturbance.  Respiratory: Negative for cough.   Cardiovascular: Negative for chest pain.  Gastrointestinal: Negative for abdominal pain and nausea.  Genitourinary: Negative for dysuria.  Musculoskeletal: Negative for back pain and joint swelling.  Skin: Negative for rash.  Neurological: Negative for weakness and headaches.  Hematological: Negative for adenopathy.    PE; Blood pressure (!) 163/75, pulse 74, temperature 98.1 F (36.7 C), temperature source Temporal, resp. rate 16, height 5\' 6"  (1.676 m), weight 237 lb (107.5 kg), SpO2 99 %. Body mass index is 38.25 kg/m.  Pt refused to be examined today.  Pertinent labs:  No results found for: TSH No results found for: WBC, HGB, HCT, MCV, PLT Lab Results  Component Value Date   CREATININE 0.81 10/09/2019   BUN 15 10/09/2019   NA 138 10/09/2019   K 4.2 10/09/2019   CL 104 10/09/2019   CO2 29 10/09/2019   No results found for: ALT, AST, GGT, ALKPHOS,  BILITOT No results found for: CHOL No results found for: HDL No results found for: LDLCALC No results found for: TRIG No results found for: CHOLHDL No results found for: PSA  No results found for: HGBA1C  ASSESSMENT AND PLAN:   Health maintenance exam:->PT WAS EXASPERATED THAT HE WAS GOING TO BE EXAMINED TODAY. I MADE IT CLEAR THAT I WOULD CERTAINLY FOREGO A PROSTATE EXAM BUT A GENERAL PHYSICAL EXAM IS WHAT  IS STANDARD WHEN YOU GET AN ANNUAL CPE/HEALTH MAINTENANCE EXAM.  HE CONTINUED TO REFUSE EXAM, JUST WANTED BLOOD WORK DONE. HE DID ONCE AGAIN COMPLAIN THAT HE WAS ANGRY THAT NO ONE WILL PRESCRIBE HIM HIS ALPRAZOLAM, ADDERALL,OR AMBIEN. I EXPRESSED EMPATHY BUT MADE IT CLEAR THAT HIS ATTITUDE TOWARDS THIS WHOLE SITUATION WAS A LITTLE EXTREME AND NOT CALLED FOR.  Reviewed age and gender appropriate health maintenance issues (prudent diet, regular exercise, health risks of tobacco and excessive alcohol, use of seatbelts, fire alarms in home, use of sunscreen).  Also reviewed age and gender appropriate health screening as well as vaccine recommendations. Vaccines: flu utd.  Tdap->he declines.  Shingrix->he declines. Labs: fasting HP labs + PSA TODAY. Prostate ca screening: DRE->pt declined, PSA. Colon ca screening: normal 2015 per pt report, recall 2025.  An After Visit Summary was printed and given to the patient.  FOLLOW UP:  Return in about 6 months (around 05/27/2020) for routine chronic illness f/u.  Signed:  Crissie Sickles, MD           11/27/2019

## 2019-11-28 LAB — LIPID PANEL
Cholesterol: 144 mg/dL (ref 0–200)
HDL: 35.4 mg/dL — ABNORMAL LOW (ref >39.00)
LDL Cholesterol: 73 mg/dL (ref 0–99)
NonHDL: 108.22
Total CHOL/HDL Ratio: 4
Triglycerides: 175 mg/dL — ABNORMAL HIGH (ref 0.0–149.0)
VLDL: 35 mg/dL (ref 0.0–40.0)

## 2019-11-28 LAB — COMPREHENSIVE METABOLIC PANEL
ALT: 16 U/L (ref 0–53)
AST: 13 U/L (ref 0–37)
Albumin: 4.5 g/dL (ref 3.5–5.2)
Alkaline Phosphatase: 51 U/L (ref 39–117)
BUN: 13 mg/dL (ref 6–23)
CO2: 25 mEq/L (ref 19–32)
Calcium: 9.7 mg/dL (ref 8.4–10.5)
Chloride: 102 mEq/L (ref 96–112)
Creatinine, Ser: 0.74 mg/dL (ref 0.40–1.50)
GFR: 108.32 mL/min (ref >60.00)
Glucose, Bld: 103 mg/dL — ABNORMAL HIGH (ref 70–99)
Potassium: 4.2 mEq/L (ref 3.5–5.1)
Sodium: 137 mEq/L (ref 135–145)
Total Bilirubin: 0.9 mg/dL (ref 0.2–1.2)
Total Protein: 7.2 g/dL (ref 6.0–8.3)

## 2019-11-28 LAB — PSA: PSA: 0.51 ng/mL (ref 0.10–4.00)

## 2020-10-07 ENCOUNTER — Ambulatory Visit: Payer: 59 | Admitting: Physician Assistant

## 2020-12-16 ENCOUNTER — Encounter: Payer: Self-pay | Admitting: Dermatology

## 2020-12-16 ENCOUNTER — Other Ambulatory Visit: Payer: Self-pay

## 2020-12-16 ENCOUNTER — Ambulatory Visit: Payer: 59 | Admitting: Dermatology

## 2020-12-16 DIAGNOSIS — L738 Other specified follicular disorders: Secondary | ICD-10-CM

## 2020-12-16 DIAGNOSIS — D485 Neoplasm of uncertain behavior of skin: Secondary | ICD-10-CM | POA: Diagnosis not present

## 2020-12-16 DIAGNOSIS — L821 Other seborrheic keratosis: Secondary | ICD-10-CM

## 2020-12-16 DIAGNOSIS — Z1283 Encounter for screening for malignant neoplasm of skin: Secondary | ICD-10-CM

## 2020-12-16 DIAGNOSIS — L918 Other hypertrophic disorders of the skin: Secondary | ICD-10-CM

## 2020-12-16 NOTE — Patient Instructions (Signed)

## 2020-12-19 NOTE — Progress Notes (Signed)
   Follow-Up Visit   Subjective  Ryan Bowen is a 59 y.o. male who presents for the following: Skin Problem (Spots on face x 1 year- ? wart).  Growth Location: Left nostril Duration:  Quality:  Associated Signs/Symptoms: Modifying Factors:  Severity:  Timing: Context:   Objective  Well appearing patient in no apparent distress; mood and affect are within normal limits. Objective  Chest - [facility]): Waist up skin examination- no atypical moles or melanoma  Objective  Mid Back: 5 to 6 mm textured brown flattopped papules  Objective  Mid Forehead: Dozen small yellow papules with a central dell.   Objective  Left Axilla, Right Axilla: Fleshy, skin-colored  pedunculated papules.    Objective  Left Nasal Sidewall: Pearly light pink 3 mm domed papule      All skin waist up examined.   Assessment & Plan    Encounter for screening for malignant neoplasm of skin Chest - [facility]ial [facility])  Annual dermatologist examination plus encouraged to check his own skin twice yearly  Seborrheic keratosis Mid Back  No need for removal if stable  Sebaceous hyperplasia of face Mid Forehead  No need for treatment if stable  Skin tag (2) Left Axilla; Right Axilla  Patient can decide in future if he desires removal  Neoplasm of uncertain behavior of skin Left Nasal Sidewall  Skin / nail biopsy Type of biopsy: tangential   Informed consent: discussed and consent obtained   Timeout: patient name, date of birth, surgical site, and procedure verified   Procedure prep:  Patient was prepped and draped in usual sterile fashion (Non sterile) Prep type:  Chlorhexidine Anesthesia: the lesion was anesthetized in a standard fashion   Anesthetic:  1% lidocaine w/ epinephrine 1-100,000 local infiltration Instrument used: flexible razor blade   Outcome: patient tolerated procedure well   Post-procedure details: wound care instructions given    Specimen 1 - Surgical  pathology Differential Diagnosis: bcc vs scc Check Margins: No     I, Lavonna Monarch, MD, have reviewed all documentation for this visit.  The documentation on 12/19/20 for the exam, diagnosis, procedures, and orders are all accurate and complete.
